# Patient Record
Sex: Male | Born: 2005
Health system: Southern US, Community
[De-identification: ages and names within clinical notes are randomized; demographics above are authoritative.]

## PROBLEM LIST (undated history)

## (undated) DIAGNOSIS — L409 Psoriasis, unspecified: Secondary | ICD-10-CM

## (undated) HISTORY — DX: Psoriasis, unspecified: L40.9

---
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
## 2024-03-15 ENCOUNTER — Other Ambulatory Visit: Payer: Self-pay | Admitting: Family Medicine

## 2024-03-16 ENCOUNTER — Encounter: Payer: Self-pay | Admitting: Family Medicine

## 2024-03-16 NOTE — Telephone Encounter (Signed)
 Letter mailed

## 2024-03-16 NOTE — Telephone Encounter (Signed)
 Stacks NTBS last OV 01/20/23 NO RF sent to pharmacy last OV greater than a year

## 2024-03-17 DIAGNOSIS — U071 COVID-19: Secondary | ICD-10-CM | POA: Diagnosis not present

## 2024-04-27 ENCOUNTER — Other Ambulatory Visit: Payer: Self-pay | Admitting: Family Medicine

## 2024-04-27 NOTE — Telephone Encounter (Signed)
 Copied from CRM [id number]. Topic: Clinical - Medication Refill >> Apr 27, 2024  3:32 PM Yolanda T wrote: Medication: clobetasol  (TEMOVATE ) 0.05 % external solution and ketoconazole  (NIZORAL ) 2 % shampoo  Has the patient contacted their pharmacy? Yes Pharmacy advised to contact provider  This is the patient's preferred pharmacy:  [id number] - [place], [place] - [address] [place] [address] Phone: [phone] Fax: [phone]  Is this the correct pharmacy for this prescription? Yes  Has the prescription been filled recently? No  Is the patient out of the medication? Yes  Has the patient been seen for an appointment in the last year OR does the patient have an upcoming appointment? Yes  Can we respond through MyChart? Yes  Agent: Please be advised that Rx refills may take up to 3 business days. We ask that you follow-up with your pharmacy.

## 2024-05-02 MED ORDER — CLOBETASOL PROPIONATE 0.05 % EX SOLN: Freq: Two times a day (BID) | CUTANEOUS | 11 refills | Status: AC
